# Patient Record
Sex: Female | Born: 1990 | Race: Black or African American | Hispanic: No | Marital: Single | Smoking: Never smoker
Health system: Southern US, Community
[De-identification: ages and names within clinical notes are randomized; demographics above are authoritative.]

## PROBLEM LIST (undated history)

## (undated) DIAGNOSIS — N83209 Unspecified ovarian cyst, unspecified side: Secondary | ICD-10-CM

## (undated) DIAGNOSIS — N390 Urinary tract infection, site not specified: Secondary | ICD-10-CM

---

## 2014-02-12 ENCOUNTER — Emergency Department (INDEPENDENT_AMBULATORY_CARE_PROVIDER_SITE_OTHER)
Admission: EM | Admit: 2014-02-12 | Discharge: 2014-02-12 | Disposition: A | Discharge status: Home / Self Care | Payer: Medicare Other | Source: Home / Self Care | Attending: Emergency Medicine | Admitting: Emergency Medicine

## 2014-02-12 ENCOUNTER — Encounter (HOSPITAL_COMMUNITY): Payer: Self-pay

## 2014-02-12 DIAGNOSIS — R350 Frequency of micturition: Secondary | ICD-10-CM

## 2014-02-12 LAB — POCT URINALYSIS DIP (DEVICE)
Bilirubin Urine: NEGATIVE
Glucose, UA: NEGATIVE mg/dL
Hgb urine dipstick: NEGATIVE
Ketones, ur: NEGATIVE mg/dL
LEUKOCYTES UA: NEGATIVE
Nitrite: NEGATIVE
PROTEIN: NEGATIVE mg/dL
Specific Gravity, Urine: >=1.03 (ref 1.005–1.030)
UROBILINOGEN UA: 0.2 mg/dL (ref 0.0–1.0)
pH: 5.5 (ref 5.0–8.0)

## 2014-02-12 LAB — POCT PREGNANCY, URINE: Preg Test, Ur: NEGATIVE

## 2014-02-12 LAB — GLUCOSE, CAPILLARY: Glucose-Capillary: 91 mg/dL (ref 70–99)

## 2014-02-12 MED ORDER — CEPHALEXIN 500 MG PO CAPS: 500.0000 mg | ORAL_CAPSULE | Freq: Three times a day (TID) | ORAL | Status: DC

## 2014-02-12 NOTE — Discharge Instructions (Signed)

## 2014-02-12 NOTE — ED Notes (Signed)
C/o 1 week duration of frequent urination. Denies pain, denies d/c, is not sexually active, and denies STD concerns today

## 2014-02-12 NOTE — ED Provider Notes (Signed)
Chief Complaint   Urinary Tract Infection   History of Present Illness   Mia Brown is a 24 year old female with schizophrenia who has had a one-week history of urinary frequency and dark colored urine. She denies any dysuria, painful urination, urgency, hematuria, cloudy urine, or malodorous urine. She's had no suprapubic or back pain and she denies nausea, vomiting, fever, or chills. She's had no GYN symptoms and is not sexually active. She has had urinary tract infections in the past, last was over a year ago.  Review of Systems   Other than as noted above, the patient denies any of the following symptoms: General:  No fevers or chills. GI:  No abdominal pain, back pain, nausea, or vomiting. GU:  No hematuria or incontinence. GYN:  No discharge, itching, vulvar pain or lesions, pelvic pain, or abnormal vaginal bleeding.  PMFSH   Past medical history, family history, social history, meds, and allergies were reviewed.  She takes Zyprexa.  Physical Examination     Vital signs:  BP 122/69 mmHg  Pulse 80  Temp(Src) 98.6 F (37 C) (Oral)  Resp 16  SpO2 97%  LMP  (LMP Unknown) Gen:  Alert, oriented, in no distress. Lungs:  Clear to auscultation, no wheezes, rales or rhonchi. Heart:  Regular rhythm, no gallop or murmer. Abdomen:  Flat and soft. There was slight suprapubic pain to palpation.  No guarding, or rebound.  No hepato-splenomegaly or mass.  Bowel sounds were normally active.  No hernia. Back:  No CVA tenderness.  Skin:  Clear, warm and dry.  Labs   Results for orders placed or performed during the hospital encounter of 02/12/14  Glucose, capillary  Result Value Ref Range   Glucose-Capillary 91 70 - 99 mg/dL  POCT urinalysis dip (device)  Result Value Ref Range   Glucose, UA NEGATIVE NEGATIVE mg/dL   Bilirubin Urine NEGATIVE NEGATIVE   Ketones, ur NEGATIVE NEGATIVE mg/dL   Specific Gravity, Urine >=1.030 1.005 - 1.030   Hgb urine dipstick NEGATIVE NEGATIVE     pH 5.5 5.0 - 8.0   Protein, ur NEGATIVE NEGATIVE mg/dL   Urobilinogen, UA 0.2 0.0 - 1.0 mg/dL   Nitrite NEGATIVE NEGATIVE   Leukocytes, UA NEGATIVE NEGATIVE  Pregnancy, urine POC  Result Value Ref Range   Preg Test, Ur NEGATIVE NEGATIVE     A urine culture was obtained.  Results are pending at this time and we will call about any positive results.  Assessment   The encounter diagnosis was Urinary frequency.   No evidence of pyelonephritis.    Plan   1.  Meds:  The following meds were prescribed:   Discharge Medication List as of 02/12/2014 11:13 AM    START taking these medications   Details  cephALEXin (KEFLEX) 500 MG capsule Take 1 capsule (500 mg total) by mouth 3 (three) times daily., Starting 02/12/2014, Until Discontinued, Normal        2.  Patient Education/Counseling:  The patient was given appropriate handouts, self care instructions, and instructed in symptomatic relief. The patient was told to avoid intercourse for 10 days, get extra fluids, and return for a follow up with her primary care doctor at the completion of treatment for a repeat UA and culture.    3.  Follow up:  The patient was told to follow up here if no better in 3 to 4 days, or sooner if becoming worse in any way, and given some red flag symptoms such as fever, persistent  vomiting, or severe flank or abdominal pain which would prompt immediate return.     Reuben Likes, MD 02/12/14 1140

## 2014-02-13 LAB — URINE CULTURE: Colony Count: 2000

## 2014-02-24 ENCOUNTER — Emergency Department (INDEPENDENT_AMBULATORY_CARE_PROVIDER_SITE_OTHER)
Admission: EM | Admit: 2014-02-24 | Discharge: 2014-02-24 | Disposition: A | Discharge status: Home / Self Care | Payer: Medicare Other | Source: Home / Self Care | Attending: Family Medicine | Admitting: Family Medicine

## 2014-02-24 ENCOUNTER — Encounter (HOSPITAL_COMMUNITY): Payer: Self-pay | Admitting: *Deleted

## 2014-02-24 DIAGNOSIS — N390 Urinary tract infection, site not specified: Secondary | ICD-10-CM | POA: Diagnosis not present

## 2014-02-24 LAB — POCT URINALYSIS DIP (DEVICE)
Bilirubin Urine: NEGATIVE
GLUCOSE, UA: NEGATIVE mg/dL
Ketones, ur: NEGATIVE mg/dL
NITRITE: NEGATIVE
PROTEIN: NEGATIVE mg/dL
SPECIFIC GRAVITY, URINE: 1.025 (ref 1.005–1.030)
UROBILINOGEN UA: 0.2 mg/dL (ref 0.0–1.0)
pH: 6.5 (ref 5.0–8.0)

## 2014-02-24 LAB — POCT PREGNANCY, URINE: Preg Test, Ur: NEGATIVE

## 2014-02-24 MED ORDER — CIPROFLOXACIN HCL 500 MG PO TABS: 500.0000 mg | ORAL_TABLET | Freq: Two times a day (BID) | ORAL | Status: DC

## 2014-02-24 NOTE — Discharge Instructions (Signed)
Take all of medicine as directed, drink lots of fluids, see your doctor if further problems. °

## 2014-02-24 NOTE — ED Notes (Signed)
PT  REPORTS   SYMPTOMS  OF  FREQUENT  URINATION  WITH  DISCOMFORT      AND  DARK  APPEARANCE  -   PT  WAS   SEEM 12  DAYS  AGO  AT THE  [facility]  AND  WAS  GIVEN  RX       BUT  STILL  HAS  SYMPTOMS

## 2014-02-24 NOTE — ED Provider Notes (Signed)
CSN: [id number]     Arrival date & time 02/24/14  1414 History   First MD Initiated Contact with Patient 02/24/14 1549     Chief Complaint  Patient presents with  . Urinary Tract Infection   (Consider location/radiation/quality/duration/timing/severity/associated sxs/prior Treatment) Patient is a 24 y.o. female presenting with urinary tract infection. The history is provided by the patient.  Urinary Tract Infection This is a recurrent problem. The current episode started more than 1 week ago (seen 1/13 for uti and given keflex, ucult was neg, sx of freq continue, no vag sx or fever.). The problem has not changed since onset.Pertinent negatives include no chest pain and no abdominal pain.    History reviewed. No pertinent past medical history. History reviewed. No pertinent past surgical history. History reviewed. No pertinent family history. History  Substance Use Topics  . Smoking status: Never Smoker   . Smokeless tobacco: Not on file  . Alcohol Use: No   OB History    No data available     Review of Systems  Constitutional: Negative.   Cardiovascular: Negative for chest pain.  Gastrointestinal: Negative.  Negative for abdominal pain.  Genitourinary: Positive for frequency. Negative for dysuria, urgency, vaginal bleeding and vaginal discharge.    Allergies  Review of patient's allergies indicates no known allergies.  Home Medications   Prior to Admission medications   Medication Sig Start Date End Date Taking? Authorizing Provider  cephALEXin (KEFLEX) 500 MG capsule Take 1 capsule (500 mg total) by mouth 3 (three) times daily. 02/12/14   Reuben Likesavid C Keller, MD  ciprofloxacin (CIPRO) 500 MG tablet Take 1 tablet (500 mg total) by mouth 2 (two) times daily. 02/24/14   Linna HoffJames D Kindl, MD   BP 139/78 mmHg  Pulse 74  Temp(Src) 98 F (36.7 C) (Oral)  Resp 16  SpO2 100%  LMP 02/24/2014 Physical Exam  Constitutional: She is oriented to person, place, and time. She appears  well-developed and well-nourished.  Abdominal: Soft. Bowel sounds are normal. She exhibits no distension and no mass. There is no tenderness. There is no rebound and no guarding.  Neurological: She is alert and oriented to person, place, and time.  Skin: Skin is warm and dry.  Nursing note and vitals reviewed.   ED Course  Procedures (including critical care time) Labs Review Labs Reviewed  POCT URINALYSIS DIP (DEVICE) - Abnormal; Notable for the following:    Hgb urine dipstick LARGE (*)    Leukocytes, UA SMALL (*)    All other components within normal limits  POCT PREGNANCY, URINE    Imaging Review No results found.   MDM   1. UTI (lower urinary tract infection)        Linna HoffJames D Kindl, MD 02/24/14 [phone]

## 2014-12-13 ENCOUNTER — Encounter (HOSPITAL_COMMUNITY): Payer: Self-pay

## 2014-12-13 DIAGNOSIS — R5381 Other malaise: Secondary | ICD-10-CM | POA: Insufficient documentation

## 2014-12-13 DIAGNOSIS — Z3202 Encounter for pregnancy test, result negative: Secondary | ICD-10-CM | POA: Diagnosis not present

## 2014-12-13 DIAGNOSIS — Z87440 Personal history of urinary (tract) infections: Secondary | ICD-10-CM | POA: Diagnosis not present

## 2014-12-13 DIAGNOSIS — Z792 Long term (current) use of antibiotics: Secondary | ICD-10-CM | POA: Diagnosis not present

## 2014-12-13 DIAGNOSIS — R6883 Chills (without fever): Secondary | ICD-10-CM | POA: Diagnosis not present

## 2014-12-13 DIAGNOSIS — R61 Generalized hyperhidrosis: Secondary | ICD-10-CM | POA: Diagnosis not present

## 2014-12-13 DIAGNOSIS — R51 Headache: Secondary | ICD-10-CM | POA: Insufficient documentation

## 2014-12-13 DIAGNOSIS — R531 Weakness: Secondary | ICD-10-CM | POA: Diagnosis present

## 2014-12-13 NOTE — ED Notes (Signed)
Pt reports lack of energy all day today. She states she started to have chills and her palms started to sweat. Denies N/V/D/abd pain. Pt states, "I just feel weird."

## 2014-12-14 ENCOUNTER — Emergency Department (HOSPITAL_COMMUNITY)
Admission: EM | Admit: 2014-12-14 | Discharge: 2014-12-14 | Disposition: A | Discharge status: Home / Self Care | Payer: Medicare Other | Attending: Emergency Medicine | Admitting: Emergency Medicine

## 2014-12-14 DIAGNOSIS — R5381 Other malaise: Secondary | ICD-10-CM

## 2014-12-14 LAB — COMPREHENSIVE METABOLIC PANEL
ALT: 22 U/L (ref 14–54)
AST: 30 U/L (ref 15–41)
Albumin: 4.7 g/dL (ref 3.5–5.0)
Alkaline Phosphatase: 42 U/L (ref 38–126)
Anion gap: 8 (ref 5–15)
BILIRUBIN TOTAL: 0.5 mg/dL (ref 0.3–1.2)
BUN: 8 mg/dL (ref 6–20)
CHLORIDE: 103 mmol/L (ref 101–111)
CO2: 26 mmol/L (ref 22–32)
Calcium: 10.1 mg/dL (ref 8.9–10.3)
Creatinine, Ser: 1.01 mg/dL — ABNORMAL HIGH (ref 0.44–1.00)
Glucose, Bld: 88 mg/dL (ref 65–99)
POTASSIUM: 3.7 mmol/L (ref 3.5–5.1)
Sodium: 137 mmol/L (ref 135–145)
TOTAL PROTEIN: 8.4 g/dL — AB (ref 6.5–8.1)

## 2014-12-14 LAB — CBC WITH DIFFERENTIAL/PLATELET
BASOS ABS: 0 10*3/uL (ref 0.0–0.1)
Basophils Relative: 0 %
EOS PCT: 1 %
Eosinophils Absolute: 0.1 10*3/uL (ref 0.0–0.7)
HCT: 41.9 % (ref 36.0–46.0)
Hemoglobin: 14.3 g/dL (ref 12.0–15.0)
LYMPHS ABS: 2.6 10*3/uL (ref 0.7–4.0)
LYMPHS PCT: 54 %
MCH: 28.3 pg (ref 26.0–34.0)
MCHC: 34.1 g/dL (ref 30.0–36.0)
MCV: 82.8 fL (ref 78.0–100.0)
Monocytes Absolute: 0.3 10*3/uL (ref 0.1–1.0)
Monocytes Relative: 5 %
NEUTROS PCT: 40 %
Neutro Abs: 2 10*3/uL (ref 1.7–7.7)
PLATELETS: 266 10*3/uL (ref 150–400)
RBC: 5.06 MIL/uL (ref 3.87–5.11)
RDW: 13.9 % (ref 11.5–15.5)
WBC: 4.9 10*3/uL (ref 4.0–10.5)

## 2014-12-14 LAB — URINALYSIS, ROUTINE W REFLEX MICROSCOPIC
Bilirubin Urine: NEGATIVE
GLUCOSE, UA: NEGATIVE mg/dL
KETONES UR: 15 mg/dL — AB
Nitrite: NEGATIVE
Protein, ur: NEGATIVE mg/dL
Specific Gravity, Urine: 1.018 (ref 1.005–1.030)
Urobilinogen, UA: 0.2 mg/dL (ref 0.0–1.0)
pH: 5 (ref 5.0–8.0)

## 2014-12-14 LAB — URINE MICROSCOPIC-ADD ON

## 2014-12-14 LAB — POC URINE PREG, ED: PREG TEST UR: NEGATIVE

## 2014-12-14 MED ORDER — SODIUM CHLORIDE 0.9 % IV SOLN
1000.0000 mL | Freq: Once | INTRAVENOUS | Status: AC
  Administered 2014-12-14: 1000 mL via INTRAVENOUS

## 2014-12-14 MED ORDER — SODIUM CHLORIDE 0.9 % IV SOLN
1000.0000 mL | INTRAVENOUS | Status: DC
  Administered 2014-12-14: 1000 mL via INTRAVENOUS

## 2014-12-14 NOTE — ED Provider Notes (Signed)
CSN: [id number]     Arrival date & time 12/13/14  2333 History  By signing my name below, I, [facility], attest that this documentation has been prepared under the direction and in the presence of Dione Boozeavid Ahley Bulls, MD. Electronically Signed: Randell PatientMarrissa Brown, ED Scribe. 12/14/2014. 1:05 AM.    Chief Complaint  Patient presents with  . Weakness   HPI HPI Comments: Mia Brown is a 24 y.o. female who presents to the Emergency Department complaining of constant, unchanging generalized fatigue that began earlier this morning. Patient reports associated headache, diaphoresis, and chills. Patient has not tried any treatments. She has eaten normal amounts of food twice today. Patient denies nausea, vomiting, dysuria, urine frequency, abdominal pain, or appetite changes. Patient currently does not take any drugs or drink EtOH.  PCP: Dr. Fleeta EmmerAshley Marlow   History reviewed. No pertinent past medical history. History reviewed. No pertinent past surgical history. No family history on file. Social History  Substance Use Topics  . Smoking status: Never Smoker   . Smokeless tobacco: None  . Alcohol Use: No   OB History    No data available     Review of Systems  Constitutional: Positive for chills, diaphoresis and fatigue. Negative for fever and appetite change.  Gastrointestinal: Negative for nausea, vomiting, abdominal pain and diarrhea.  Genitourinary: Negative for dysuria and frequency.  Neurological: Positive for headaches.      Allergies  Review of patient's allergies indicates no known allergies.  Home Medications   Prior to Admission medications   Medication Sig Start Date End Date Taking? Authorizing Provider  cephALEXin (KEFLEX) 500 MG capsule Take 1 capsule (500 mg total) by mouth 3 (three) times daily. 02/12/14   Reuben Likesavid C Keller, MD  ciprofloxacin (CIPRO) 500 MG tablet Take 1 tablet (500 mg total) by mouth 2 (two) times daily. 02/24/14   Linna HoffJames D Kindl, MD   BP 141/99  mmHg  Pulse 96  Temp(Src) 98.6 F (37 C) (Oral)  Resp 20  Ht 5\' 8"  (1.727 m)  Wt 166 lb (75.297 kg)  BMI 25.25 kg/m2  SpO2 99%  LMP 09/12/2014 Physical Exam  Constitutional: She is oriented to person, place, and time. She appears well-developed and well-nourished. No distress.  HENT:  Head: Normocephalic and atraumatic.  Eyes: Conjunctivae and EOM are normal.  Neck: Neck supple. No tracheal deviation present.  Cardiovascular: Normal rate and regular rhythm.   Pulmonary/Chest: Effort normal. No respiratory distress.  Abdominal: Soft. Bowel sounds are normal.  Musculoskeletal: Normal range of motion.  Neurological: She is alert and oriented to person, place, and time.  Skin: Skin is warm and dry.  Psychiatric: She has a normal mood and affect. Her behavior is normal.  Nursing note and vitals reviewed.   ED Course  Procedures  DIAGNOSTIC STUDIES: Oxygen Saturation is 99% on RA, normal by my interpretation.    COORDINATION OF CARE: 12:57 AM Will administer IV fluids. Ordered urinalysis and CBC. Discussed treatment plan with pt at bedside and pt agreed to plan.  1:54 AM Upon re-check patient has improved. Discussed lab results with patient. Advised patient to increase fluid intake.   Labs Review Results for orders placed or performed during the hospital encounter of 12/14/14  Comprehensive metabolic panel  Result Value Ref Range   Sodium 137 135 - 145 mmol/L   Potassium 3.7 3.5 - 5.1 mmol/L   Chloride 103 101 - 111 mmol/L   CO2 26 22 - 32 mmol/L   Glucose, Bld 88 65 -  99 mg/dL   BUN 8 6 - 20 mg/dL   Creatinine, Ser 1.61 (H) 0.44 - 1.00 mg/dL   Calcium 09.6 8.9 - 04.5 mg/dL   Total Protein 8.4 (H) 6.5 - 8.1 g/dL   Albumin 4.7 3.5 - 5.0 g/dL   AST 30 15 - 41 U/L   ALT 22 14 - 54 U/L   Alkaline Phosphatase 42 38 - 126 U/L   Total Bilirubin 0.5 0.3 - 1.2 mg/dL   GFR calc non Af Amer >60 >60 mL/min   GFR calc Af Amer >60 >60 mL/min   Anion gap 8 5 - 15  CBC with  Differential  Result Value Ref Range   WBC 4.9 4.0 - 10.5 K/uL   RBC 5.06 3.87 - 5.11 MIL/uL   Hemoglobin 14.3 12.0 - 15.0 g/dL   HCT 40.9 81.1 - 91.4 %   MCV 82.8 78.0 - 100.0 fL   MCH 28.3 26.0 - 34.0 pg   MCHC 34.1 30.0 - 36.0 g/dL   RDW 78.2 95.6 - 21.3 %   Platelets 266 150 - 400 K/uL   Neutrophils Relative % 40 %   Neutro Abs 2.0 1.7 - 7.7 K/uL   Lymphocytes Relative 54 %   Lymphs Abs 2.6 0.7 - 4.0 K/uL   Monocytes Relative 5 %   Monocytes Absolute 0.3 0.1 - 1.0 K/uL   Eosinophils Relative 1 %   Eosinophils Absolute 0.1 0.0 - 0.7 K/uL   Basophils Relative 0 %   Basophils Absolute 0.0 0.0 - 0.1 K/uL  Urinalysis, Routine w reflex microscopic  Result Value Ref Range   Color, Urine YELLOW YELLOW   APPearance CLOUDY (A) CLEAR   Specific Gravity, Urine 1.018 1.005 - 1.030   pH 5.0 5.0 - 8.0   Glucose, UA NEGATIVE NEGATIVE mg/dL   Hgb urine dipstick LARGE (A) NEGATIVE   Bilirubin Urine NEGATIVE NEGATIVE   Ketones, ur 15 (A) NEGATIVE mg/dL   Protein, ur NEGATIVE NEGATIVE mg/dL   Urobilinogen, UA 0.2 0.0 - 1.0 mg/dL   Nitrite NEGATIVE NEGATIVE   Leukocytes, UA SMALL (A) NEGATIVE  Urine microscopic-add on  Result Value Ref Range   Squamous Epithelial / LPF RARE RARE   WBC, UA 3-6 <3 WBC/hpf   RBC / HPF 7-10 <3 RBC/hpf   Bacteria, UA FEW (A) RARE  POC Urine Pregnancy, ED (do NOT order at [facility])  Result Value Ref Range   Preg Test, Ur NEGATIVE NEGATIVE   I have personally reviewed and evaluated these lab results as part of my medical decision-making.  MDM   Final diagnoses:  Malaise    Malaise and weakness of uncertain cause. Suspect viral illness. Old records are reviewed and she has prior ED visit for UTI. Urinalysis will be checked as well as screening labs and she'll be given IV hydration.  She feels much better after IV hydration. Laboratory workup is significant for right shift on CBC suggesting viral illness. She is reassured of about benign workup and is  discharged with instructions to encourage oral fluids.  I personally performed the services described in this documentation, which was scribed in my presence. The recorded information has been reviewed and is accurate.      Dione Booze, MD 12/14/14 [phone]

## 2014-12-14 NOTE — ED Notes (Signed)
Dr Glick in room. 

## 2014-12-14 NOTE — Discharge Instructions (Signed)
Drink plenty of fluids.  Return if symptoms are getting worse. 

## 2015-02-28 ENCOUNTER — Emergency Department (HOSPITAL_COMMUNITY)
Admission: EM | Admit: 2015-02-28 | Discharge: 2015-02-28 | Disposition: A | Discharge status: Home / Self Care | Payer: Medicare Other | Attending: Emergency Medicine | Admitting: Emergency Medicine

## 2015-02-28 ENCOUNTER — Encounter (HOSPITAL_COMMUNITY): Payer: Self-pay | Admitting: Emergency Medicine

## 2015-02-28 ENCOUNTER — Emergency Department (HOSPITAL_COMMUNITY): Payer: Medicare Other

## 2015-02-28 DIAGNOSIS — N898 Other specified noninflammatory disorders of vagina: Secondary | ICD-10-CM | POA: Diagnosis not present

## 2015-02-28 DIAGNOSIS — R1031 Right lower quadrant pain: Secondary | ICD-10-CM | POA: Insufficient documentation

## 2015-02-28 DIAGNOSIS — Z79899 Other long term (current) drug therapy: Secondary | ICD-10-CM | POA: Insufficient documentation

## 2015-02-28 DIAGNOSIS — R350 Frequency of micturition: Secondary | ICD-10-CM | POA: Insufficient documentation

## 2015-02-28 DIAGNOSIS — Z8742 Personal history of other diseases of the female genital tract: Secondary | ICD-10-CM | POA: Insufficient documentation

## 2015-02-28 DIAGNOSIS — R1030 Lower abdominal pain, unspecified: Secondary | ICD-10-CM | POA: Insufficient documentation

## 2015-02-28 DIAGNOSIS — Z87440 Personal history of urinary (tract) infections: Secondary | ICD-10-CM | POA: Diagnosis not present

## 2015-02-28 DIAGNOSIS — Z3202 Encounter for pregnancy test, result negative: Secondary | ICD-10-CM | POA: Insufficient documentation

## 2015-02-28 DIAGNOSIS — R102 Pelvic and perineal pain: Secondary | ICD-10-CM | POA: Diagnosis not present

## 2015-02-28 DIAGNOSIS — R3915 Urgency of urination: Secondary | ICD-10-CM | POA: Insufficient documentation

## 2015-02-28 HISTORY — DX: Urinary tract infection, site not specified: N39.0

## 2015-02-28 HISTORY — DX: Unspecified ovarian cyst, unspecified side: N83.209

## 2015-02-28 LAB — URINALYSIS, ROUTINE W REFLEX MICROSCOPIC
Bilirubin Urine: NEGATIVE
GLUCOSE, UA: NEGATIVE mg/dL
Hgb urine dipstick: NEGATIVE
Ketones, ur: NEGATIVE mg/dL
LEUKOCYTES UA: NEGATIVE
Nitrite: NEGATIVE
Protein, ur: NEGATIVE mg/dL
Specific Gravity, Urine: 1.023 (ref 1.005–1.030)
pH: 5 (ref 5.0–8.0)

## 2015-02-28 LAB — POC URINE PREG, ED: PREG TEST UR: NEGATIVE

## 2015-02-28 LAB — WET PREP, GENITAL
CLUE CELLS WET PREP: NONE SEEN
Sperm: NONE SEEN
TRICH WET PREP: NONE SEEN
Yeast Wet Prep HPF POC: NONE SEEN

## 2015-02-28 MED ORDER — PHENAZOPYRIDINE HCL 200 MG PO TABS: 200.0000 mg | ORAL_TABLET | Freq: Three times a day (TID) | ORAL | Status: DC

## 2015-02-28 NOTE — ED Provider Notes (Signed)
CSN: [id number]     Arrival date & time 02/28/15  0757 History   First MD Initiated Contact with Patient 02/28/15 0800     Chief Complaint  Patient presents with  . Urinary Tract Infection     (Consider location/radiation/quality/duration/timing/severity/associated sxs/prior Treatment) HPI Comments: Patient presents with complaint of persistent urinary tract infection. Patient states that she started having symptoms approximately 10 days ago. She describes suprapubic and right lower quadrant abdominal pain, increased frequency and urgency with urination. She denies any fevers, vomiting. No vaginal bleeding or discharge. Patient states that she is not sexually active at the current time. Patient was seen at outside urgent care where she was prescribed Macrobid 100 mg twice a day. She took this course of antibiotics without improvement. She was seen back again and started on Cipro 500 mg twice a day. She has taken 2 days worth of this. She states that she is not really feeling much better. Her abdominal pain is persistent but slightly improved this morning. No history of abdominal surgeries. No other treatments prior to arrival. Onset of symptoms acute. Course is constant. Nothing makes symptoms better or worse.  Patient is a 25 y.o. female presenting with urinary tract infection. The history is provided by the patient.  Urinary Tract Infection Associated symptoms: abdominal pain   Associated symptoms: no fever, no flank pain, no nausea, no vaginal discharge and no vomiting     Past Medical History  Diagnosis Date  . UTI (lower urinary tract infection)   . Ovarian cyst    History reviewed. No pertinent past surgical history. History reviewed. No pertinent family history. Social History  Substance Use Topics  . Smoking status: Never Smoker   . Smokeless tobacco: Never Used  . Alcohol Use: No   OB History    No data available     Review of Systems  Constitutional: Negative for fever.   HENT: Negative for rhinorrhea and sore throat.   Eyes: Negative for redness.  Respiratory: Negative for cough.   Cardiovascular: Negative for chest pain.  Gastrointestinal: Positive for abdominal pain. Negative for nausea, vomiting and diarrhea.  Genitourinary: Positive for urgency and frequency. Negative for dysuria, hematuria, flank pain, vaginal bleeding, vaginal discharge and pelvic pain.  Musculoskeletal: Negative for myalgias.  Skin: Negative for rash.  Neurological: Negative for headaches.      Allergies  Review of patient's allergies indicates no known allergies.  Home Medications   Prior to Admission medications   Medication Sig Start Date End Date Taking? Authorizing Provider  OLANZapine (ZYPREXA) 10 MG tablet Take 10 mg by mouth at bedtime.    Historical Provider, MD   BP 130/88 mmHg  Pulse 62  Temp(Src) 97.8 F (36.6 C) (Oral)  Resp 16  SpO2 99%  LMP 01/22/2015   Physical Exam  Constitutional: She appears well-developed and well-nourished.  HENT:  Head: Normocephalic and atraumatic.  Eyes: Conjunctivae are normal. Right eye exhibits no discharge. Left eye exhibits no discharge.  Neck: Normal range of motion. Neck supple.  Cardiovascular: Normal rate, regular rhythm and normal heart sounds.   Pulmonary/Chest: Effort normal and breath sounds normal.  Abdominal: Soft. She exhibits no distension. There is tenderness (minimal right lower quadrant tenderness and suprapubic tenderness without rebound or guarding.). There is no rebound and no guarding.  Genitourinary: Uterus normal. There is no rash, tenderness or lesion on the right labia. There is no rash, tenderness or lesion on the left labia. Uterus is not enlarged and not tender. Cervix  exhibits discharge (mucous). Cervix exhibits no motion tenderness. Right adnexum displays tenderness. Right adnexum displays no mass and no fullness. Left adnexum displays no mass, no tenderness and no fullness. No signs of injury  around the vagina. Vaginal discharge (mucous) found.  Neurological: She is alert.  Skin: Skin is warm and dry.  Psychiatric: She has a normal mood and affect.  Nursing note and vitals reviewed.   ED Course  Procedures (including critical care time) Labs Review Labs Reviewed  WET PREP, GENITAL - Abnormal; Notable for the following:    WBC, Wet Prep HPF POC MANY (*)    All other components within normal limits  URINALYSIS, ROUTINE W REFLEX MICROSCOPIC (NOT AT [facility]) - Abnormal; Notable for the following:    APPearance CLOUDY (*)    All other components within normal limits  URINE CULTURE  POC URINE PREG, ED  GC/CHLAMYDIA PROBE AMP (Green) NOT AT [facility]    Imaging Review US Transvaginal Non-ob  02/28/2015  CLINICAL DATA:  Two week history of right lower quadrant pain. EXAM: TRANSABDOMINAL AND TRANSVAGINAL ULTRASOUND OF PELVIS TECHNIQUE: Both transabdominal and transvaginal ultrasound examinations of the pelvis were performed. Transabdominal technique was performed for global imaging of the pelvis including uterus, ovaries, adnexal regions, and pelvic cul-de-sac. It was necessary to proceed with endovaginal exam following the transabdominal exam to visualize the ovaries. COMPARISON:  None FINDINGS: Uterus Measurements: 8.2 x 3.7 x 4.6 cm. No fibroids or other mass visualized. Endometrium Thickness: 9 mm.  No focal abnormality visualized. Right ovary Measurements: 3.5 x 2.1 x 3.0 cm. Normal appearance/no adnexal mass. Left ovary Measurements: 4.5 x 1.6 x 3.0 cm. Normal appearance/no adnexal mass. Other findings No abnormal free fluid. IMPRESSION: Normal pelvic ultrasound. Electronically Signed   By: Kennith Center M.D.   On: 02/28/2015 11:43   US Pelvis Complete  02/28/2015  CLINICAL DATA:  Two week history of right lower quadrant pain. EXAM: TRANSABDOMINAL AND TRANSVAGINAL ULTRASOUND OF PELVIS TECHNIQUE: Both transabdominal and transvaginal ultrasound examinations of the pelvis were  performed. Transabdominal technique was performed for global imaging of the pelvis including uterus, ovaries, adnexal regions, and pelvic cul-de-sac. It was necessary to proceed with endovaginal exam following the transabdominal exam to visualize the ovaries. COMPARISON:  None FINDINGS: Uterus Measurements: 8.2 x 3.7 x 4.6 cm. No fibroids or other mass visualized. Endometrium Thickness: 9 mm.  No focal abnormality visualized. Right ovary Measurements: 3.5 x 2.1 x 3.0 cm. Normal appearance/no adnexal mass. Left ovary Measurements: 4.5 x 1.6 x 3.0 cm. Normal appearance/no adnexal mass. Other findings No abnormal free fluid. IMPRESSION: Normal pelvic ultrasound. Electronically Signed   By: Kennith Center M.D.   On: 02/28/2015 11:43   I have personally reviewed and evaluated these images and lab results as part of my medical decision-making.   EKG Interpretation None       8:39 AM Patient seen and examined. Work-up initiated. Medications ordered.   Vital signs reviewed and are as follows: BP 130/88 mmHg  Pulse 62  Temp(Src) 97.8 F (36.6 C) (Oral)  Resp 16  SpO2 99%  LMP 01/22/2015  9:17 AM UA without signs of UTI. Will proceed with pelvic exam.   9:35 AM Pelvic exam performed with NT chaperone. Given R adnexal pain will obtain US. No abdominal pain.   12:03 PM ultrasound is normal. Wet prep is unrevealing.  Will treat with Pyridium. Patient encouraged to follow-up with her PCP for further evaluation. She will complete course of antibiotics as previously prescribed.  The patient was urged to return to the Emergency Department immediately with worsening of current symptoms, worsening abdominal pain, persistent vomiting, blood noted in stools, fever, or any other concerns. The patient verbalized understanding.    MDM   Final diagnoses:  Suprapubic pain, unspecified laterality  Increased urinary frequency   Patient presenting with lower abdominal pain, urinary irritative symptoms  including frequency and urgency. Patient was recently treated for urinary tract infection. UA is clear today. Patient had some pelvic tenderness on pelvic exam without any other infectious findings. Ultrasound was ordered which was negative. Patient does not have any true abdominal tenderness on exam and pain seems to be more in the pelvis. There is no free fluid to suggest significant intra-abdominal infection. I do not feel that lab workup is indicated at this point for further evaluation of abdominal pain. Patient appears well, nontoxic. Will treat symptomatically and have her follow-up.    Renne Crigler, PA-C 02/28/15 1205  Arby Barrette, MD 03/08/15 [phone]

## 2015-02-28 NOTE — ED Notes (Signed)
Pt from home with continuing UTI symptoms.  Pt took a Macrobid 100 mg BID x 5 days with no relief.  Was started on Cipro  500 mg BID x 7 days 2 days ago.  Pt reports lower abdominal pain and RLQ pain.  NAD, A&O.

## 2015-02-28 NOTE — Discharge Instructions (Signed)
Please read and follow all provided instructions.  Your diagnoses today include:  1. Suprapubic pain, unspecified laterality   2. Pelvic pain in female   3. Increased urinary frequency     Tests performed today include:  Urine test to look for infection and pregnancy (in women) - no sign of infection  Ultrasound - no cysts  Vital signs. See below for your results today.   Medications prescribed:   Pyridium - medication for urinary tract infection symptoms.   This medication will turn your urine orange. This is normal.   Take any prescribed medications only as directed.  Home care instructions:   Follow any educational materials contained in this packet.  Follow-up instructions: Please follow-up with your primary care provider in the next 3 days for further evaluation of your symptoms.    Return instructions:  SEEK IMMEDIATE MEDICAL ATTENTION IF:  The pain does not go away or becomes severe   A temperature above 101F develops   Repeated vomiting occurs (multiple episodes)   The pain becomes localized to portions of the abdomen. The right side could possibly be appendicitis. In an adult, the left lower portion of the abdomen could be colitis or diverticulitis.   Blood is being passed in stools or vomit (bright red or black tarry stools)   You develop chest pain, difficulty breathing, dizziness or fainting, or become confused, poorly responsive, or inconsolable (young children)  If you have any other emergent concerns regarding your health  Additional Information: Abdominal (belly) pain can be caused by many things. Your caregiver performed an examination and possibly ordered blood/urine tests and imaging (CT scan, x-rays, ultrasound). Many cases can be observed and treated at home after initial evaluation in the emergency department. Even though you are being discharged home, abdominal pain can be unpredictable. Therefore, you need a repeated exam if your pain does not  resolve, returns, or worsens. Most patients with abdominal pain don't have to be admitted to the hospital or have surgery, but serious problems like appendicitis and gallbladder attacks can start out as nonspecific pain. Many abdominal conditions cannot be diagnosed in one visit, so follow-up evaluations are very important.  Your vital signs today were: BP 118/80 mmHg   Pulse 71   Temp(Src) 97.8 F (36.6 C) (Oral)   Resp 16   SpO2 100%   LMP 01/22/2015 If your blood pressure (bp) was elevated above 135/85 this visit, please have this repeated by your doctor within one month. --------------

## 2015-02-28 NOTE — ED Notes (Signed)
Pt collecting urine sample and changing clothes.

## 2015-03-01 LAB — URINE CULTURE: Culture: NO GROWTH

## 2015-03-02 LAB — GC/CHLAMYDIA PROBE AMP (~~LOC~~) NOT AT ARMC
Chlamydia: NEGATIVE
NEISSERIA GONORRHEA: NEGATIVE

## 2015-03-16 ENCOUNTER — Encounter (HOSPITAL_COMMUNITY): Payer: Self-pay | Admitting: Emergency Medicine

## 2015-03-16 ENCOUNTER — Emergency Department (INDEPENDENT_AMBULATORY_CARE_PROVIDER_SITE_OTHER)
Admission: EM | Admit: 2015-03-16 | Discharge: 2015-03-16 | Disposition: A | Discharge status: Home / Self Care | Payer: Medicare Other | Source: Home / Self Care | Attending: Emergency Medicine | Admitting: Emergency Medicine

## 2015-03-16 DIAGNOSIS — R300 Dysuria: Secondary | ICD-10-CM | POA: Diagnosis not present

## 2015-03-16 DIAGNOSIS — N390 Urinary tract infection, site not specified: Secondary | ICD-10-CM | POA: Diagnosis not present

## 2015-03-16 LAB — POCT URINALYSIS DIP (DEVICE)
BILIRUBIN URINE: NEGATIVE
GLUCOSE, UA: NEGATIVE mg/dL
HGB URINE DIPSTICK: NEGATIVE
Ketones, ur: NEGATIVE mg/dL
NITRITE: NEGATIVE
Protein, ur: NEGATIVE mg/dL
Specific Gravity, Urine: 1.02 (ref 1.005–1.030)
Urobilinogen, UA: 0.2 mg/dL (ref 0.0–1.0)
pH: 5.5 (ref 5.0–8.0)

## 2015-03-16 MED ORDER — PHENAZOPYRIDINE HCL 200 MG PO TABS: 200.0000 mg | ORAL_TABLET | Freq: Three times a day (TID) | ORAL | Status: DC | PRN

## 2015-03-16 MED ORDER — CEPHALEXIN 250 MG PO CAPS: 250.0000 mg | ORAL_CAPSULE | Freq: Four times a day (QID) | ORAL | Status: DC

## 2015-03-16 NOTE — ED Provider Notes (Signed)
CSN: [id number]     Arrival date & time 03/16/15  1531 History   None    Chief Complaint  Patient presents with  . Dysuria  . Vaginal Discharge   (Consider location/radiation/quality/duration/timing/severity/associated sxs/prior Treatment) HPI History obtained from patient:   LOCATION:vaginal SEVERITY:no pain DURATION:1 week CONTEXT: recent UTI treated through ED QUALITY:similar to previous infections MODIFYING FACTORS:none ASSOCIATED SYMPTOMS:irritation, itching TIMING:constant OCCUPATION:  Past Medical History  Diagnosis Date  . UTI (lower urinary tract infection)   . Ovarian cyst    History reviewed. No pertinent past surgical history. History reviewed. No pertinent family history. Social History  Substance Use Topics  . Smoking status: Never Smoker   . Smokeless tobacco: Never Used  . Alcohol Use: No   OB History    No data available     Review of Systems ROS +'ve Dysuria  Denies: HEADACHE, NAUSEA, ABDOMINAL PAIN, CHEST PAIN, CONGESTION,  SHORTNESS OF BREATH  Allergies  Review of patient's allergies indicates no known allergies.  Home Medications   Prior to Admission medications   Medication Sig Start Date End Date Taking? Authorizing Provider  OLANZapine (ZYPREXA) 10 MG tablet Take 10 mg by mouth at bedtime.   Yes Historical Provider, MD  ciprofloxacin (CIPRO) 500 MG tablet Take 500 mg by mouth 2 (two) times daily. 02/25/15   Historical Provider, MD  phenazopyridine (PYRIDIUM) 200 MG tablet Take 1 tablet (200 mg total) by mouth 3 (three) times daily. 02/28/15   Renne Crigler, PA-C   Meds Ordered and Administered this Visit  Medications - No data to display  BP 129/81 mmHg  Pulse 68  Temp(Src) 99.4 F (37.4 C) (Oral)  Resp 18  SpO2 100%  LMP 01/22/2015 No data found.   Physical Exam  Constitutional: She is oriented to person, place, and time. She appears well-developed and well-nourished. No distress.  HENT:  Head: Normocephalic and atraumatic.   Pulmonary/Chest: Effort normal.  Musculoskeletal: Normal range of motion.  Neurological: She is alert and oriented to person, place, and time.  Skin: Skin is warm and dry.  Psychiatric: She has a normal mood and affect. Her behavior is normal.    ED Course  Procedures (including critical care time)  Labs Review Labs Reviewed  POCT URINALYSIS DIP (DEVICE) - Abnormal; Notable for the following:    Leukocytes, UA TRACE (*)    All other components within normal limits    Imaging Review No results found.   Visual Acuity Review  Right Eye Distance:   Left Eye Distance:   Bilateral Distance:    Right Eye Near:   Left Eye Near:    Bilateral Near:         MDM   1. UTI (lower urinary tract infection)   2. Dysuria    Patient is advised to continue home symptomatic treatment. Prescription for keflex/pyridium sent pharmacy patient has indicated. Patient is advised that if there are new or worsening symptoms or attend the emergency department, or contact primary care provider. Instructions of care provided discharged home in stable condition. Return to work/school note provided.  THIS NOTE WAS GENERATED USING A VOICE RECOGNITION SOFTWARE PROGRAM. ALL REASONABLE EFFORTS  WERE MADE TO PROOFREAD THIS DOCUMENT FOR ACCURACY.     Tharon Aquas, PA 03/17/15 [phone]

## 2015-03-16 NOTE — ED Notes (Signed)
The patient presented to the [facility] with a complaint of vaginal itching, discharge and dysuria x 10 days.

## 2015-03-16 NOTE — Discharge Instructions (Signed)

## 2015-03-27 ENCOUNTER — Other Ambulatory Visit (HOSPITAL_COMMUNITY)
Admission: RE | Admit: 2015-03-27 | Discharge: 2015-03-27 | Disposition: A | Discharge status: Home / Self Care | Payer: Medicare Other | Source: Ambulatory Visit | Attending: Emergency Medicine | Admitting: Emergency Medicine

## 2015-03-27 ENCOUNTER — Emergency Department (INDEPENDENT_AMBULATORY_CARE_PROVIDER_SITE_OTHER)
Admission: EM | Admit: 2015-03-27 | Discharge: 2015-03-27 | Disposition: A | Discharge status: Home / Self Care | Payer: Medicare Other | Source: Home / Self Care | Attending: Emergency Medicine | Admitting: Emergency Medicine

## 2015-03-27 ENCOUNTER — Encounter (HOSPITAL_COMMUNITY): Payer: Self-pay

## 2015-03-27 DIAGNOSIS — N760 Acute vaginitis: Secondary | ICD-10-CM

## 2015-03-27 LAB — POCT URINALYSIS DIP (DEVICE)
BILIRUBIN URINE: NEGATIVE
GLUCOSE, UA: NEGATIVE mg/dL
Hgb urine dipstick: NEGATIVE
KETONES UR: NEGATIVE mg/dL
NITRITE: NEGATIVE
PH: 6 (ref 5.0–8.0)
PROTEIN: NEGATIVE mg/dL
SPECIFIC GRAVITY, URINE: 1.02 (ref 1.005–1.030)
Urobilinogen, UA: 0.2 mg/dL (ref 0.0–1.0)

## 2015-03-27 LAB — POCT PREGNANCY, URINE: PREG TEST UR: NEGATIVE

## 2015-03-27 MED ORDER — FLUCONAZOLE 150 MG PO TABS: 150.0000 mg | ORAL_TABLET | Freq: Once | ORAL | Status: DC

## 2015-03-27 NOTE — Discharge Instructions (Signed)
I do not see any sign of urinary tract infection. I have sent your urine for culture to make sure the infection has been cleared. The itching and irritation have is likely from some lingering yeast. Take Diflucan one pill today. Take the second pill if you are still having itching in 3 days. Follow-up as needed.

## 2015-03-27 NOTE — ED Notes (Signed)
Patient states that she has had a UTI x1 month and has taken 3 different antibiotics which eventually led to Yeast infection and she took an OTC treatment  No acute distress

## 2015-03-27 NOTE — ED Provider Notes (Signed)
CSN: [id number]     Arrival date & time 03/27/15  1523 History   First MD Initiated Contact with Patient 03/27/15 1616     Chief Complaint  Patient presents with  . Urinary Tract Infection  . Vaginitis   (Consider location/radiation/quality/duration/timing/severity/associated sxs/prior Treatment) HPI  She is a 25 year old woman here for follow-up UTI. She states she has been diagnosed with several UTIs over the last month. She has been on several courses of antibiotics which caused her to have a yeast infection. She got over-the-counter Monistat yesterday which has helped, but not resolved the itching and irritation in the vaginal area. She denies any dysuria, urinary frequency, urgency. No abdominal pain. No fevers or chills.  Past Medical History  Diagnosis Date  . UTI (lower urinary tract infection)   . Ovarian cyst    History reviewed. No pertinent past surgical history. No family history on file. Social History  Substance Use Topics  . Smoking status: Never Smoker   . Smokeless tobacco: Never Used  . Alcohol Use: No   OB History    No data available     Review of Systems As in history of present illness Allergies  Review of patient's allergies indicates no known allergies.  Home Medications   Prior to Admission medications   Medication Sig Start Date End Date Taking? Authorizing Provider  OLANZapine (ZYPREXA) 10 MG tablet Take 10 mg by mouth at bedtime.   Yes Historical Provider, MD  fluconazole (DIFLUCAN) 150 MG tablet Take 1 tablet (150 mg total) by mouth once. Take 2nd pill if symptoms not completely resolved in 3 days. 03/27/15   Charm Rings, MD   Meds Ordered and Administered this Visit  Medications - No data to display  BP 134/88 mmHg  Pulse 67  Temp(Src) 98.6 F (37 C) (Oral)  Resp 12  SpO2 100%  LMP 03/18/2015 (Exact Date) No data found.   Physical Exam  Constitutional: She is oriented to person, place, and time. She appears well-developed and  well-nourished. No distress.  Cardiovascular: Normal rate.   Pulmonary/Chest: Effort normal.  Neurological: She is alert and oriented to person, place, and time.    ED Course  Procedures (including critical care time)  Labs Review Labs Reviewed  POCT URINALYSIS DIP (DEVICE) - Abnormal; Notable for the following:    Leukocytes, UA SMALL (*)    All other components within normal limits  URINE CULTURE  POCT PREGNANCY, URINE    Imaging Review No results found.    MDM   1. Vaginitis    UA was slightly leukocytes. I suspect this is left over from yeast infection. I sent the urine for culture to make sure her UTI has cleared. Diflucan for yeast. Follow-up as needed.    Charm Rings, MD 03/27/15 [phone]

## 2015-03-29 LAB — URINE CULTURE: Culture: >=100000

## 2015-04-03 ENCOUNTER — Emergency Department (HOSPITAL_COMMUNITY)
Admission: EM | Admit: 2015-04-03 | Discharge: 2015-04-03 | Disposition: A | Discharge status: Home / Self Care | Payer: Medicare Other | Attending: Emergency Medicine | Admitting: Emergency Medicine

## 2015-04-03 ENCOUNTER — Encounter (HOSPITAL_COMMUNITY): Payer: Self-pay | Admitting: Emergency Medicine

## 2015-04-03 DIAGNOSIS — R300 Dysuria: Secondary | ICD-10-CM | POA: Diagnosis present

## 2015-04-03 DIAGNOSIS — Z79899 Other long term (current) drug therapy: Secondary | ICD-10-CM | POA: Diagnosis not present

## 2015-04-03 DIAGNOSIS — Z8742 Personal history of other diseases of the female genital tract: Secondary | ICD-10-CM | POA: Insufficient documentation

## 2015-04-03 DIAGNOSIS — R3915 Urgency of urination: Secondary | ICD-10-CM | POA: Insufficient documentation

## 2015-04-03 DIAGNOSIS — Z3202 Encounter for pregnancy test, result negative: Secondary | ICD-10-CM | POA: Insufficient documentation

## 2015-04-03 DIAGNOSIS — Z87440 Personal history of urinary (tract) infections: Secondary | ICD-10-CM | POA: Diagnosis not present

## 2015-04-03 LAB — URINE MICROSCOPIC-ADD ON

## 2015-04-03 LAB — URINALYSIS, ROUTINE W REFLEX MICROSCOPIC
Bilirubin Urine: NEGATIVE
GLUCOSE, UA: NEGATIVE mg/dL
Hgb urine dipstick: NEGATIVE
KETONES UR: NEGATIVE mg/dL
NITRITE: NEGATIVE
PROTEIN: NEGATIVE mg/dL
Specific Gravity, Urine: 1.025 (ref 1.005–1.030)
pH: 6.5 (ref 5.0–8.0)

## 2015-04-03 LAB — POC URINE PREG, ED: PREG TEST UR: NEGATIVE

## 2015-04-03 MED ORDER — PHENAZOPYRIDINE HCL 200 MG PO TABS: 200.0000 mg | ORAL_TABLET | Freq: Three times a day (TID) | ORAL | Status: DC

## 2015-04-03 MED ORDER — CEPHALEXIN 500 MG PO CAPS: 500.0000 mg | ORAL_CAPSULE | Freq: Two times a day (BID) | ORAL | Status: DC

## 2015-04-03 NOTE — ED Notes (Signed)
Pt sts dysuria and frequent urination; pt sts recent yeast infection; pt sts pressure when going to bathroom; pt denies vaginal discharge

## 2015-04-03 NOTE — Discharge Instructions (Signed)
Read the information below.  Use the prescribed medication as directed.  Please discuss all new medications with your pharmacist.  You may return to the Emergency Department at any time for worsening condition or any new symptoms that concern you.   If you develop high fevers, worsening abdominal pain, uncontrolled vomiting, or are unable to tolerate fluids by mouth, return to the ER for a recheck.  ° °

## 2015-04-03 NOTE — ED Provider Notes (Signed)
CSN: [id number]     Arrival date & time 04/03/15  1131 History  By signing my name below, I, Mia Brown, attest that this documentation has been prepared under the direction and in the presence of non-physician practitioner, Mia Dredge, PA-C. Electronically Signed: Freida Brown, Scribe. 04/03/2015. 1:46 PM.     Chief Complaint  Patient presents with  . Dysuria    The history is provided by the patient. No language interpreter was used.     HPI Comments:  Mia Brown is a 25 y.o. female who presents to the Emergency Department complaining of frequent urination x 1 week with an associated pressure with urination. She denies pain with urination, fever, chills, abnormal vaginal discharge, vaginal bleeding,  blood in stool and recent abnormal BMs. Pt notes frequent UTIs in the last 3 months; states she has been treated 3 times at [facility] and in the ED with improvement of her symptoms.  Pt was also recently treated for a yeast infection, notes vaginal irritation has resolved. She denies h/o abdominal surgeries. LNMP 03/18/15.   Past Medical History  Diagnosis Date  . UTI (lower urinary tract infection)   . Ovarian cyst    History reviewed. No pertinent past surgical history. History reviewed. No pertinent family history. Social History  Substance Use Topics  . Smoking status: Never Smoker   . Smokeless tobacco: Never Used  . Alcohol Use: No   OB History    No data available     Review of Systems  Constitutional: Negative for fever and chills.  Gastrointestinal: Positive for blood in stool.  Genitourinary: Positive for frequency. Negative for dysuria, vaginal bleeding and vaginal discharge.  All other systems reviewed and are negative.  Allergies  Review of patient's allergies indicates no known allergies.  Home Medications   Prior to Admission medications   Medication Sig Start Date End Date Taking? Authorizing Provider  cephALEXin (KEFLEX) 500 MG capsule Take 1 capsule (500 mg  total) by mouth 2 (two) times daily. 04/03/15   Mia Dredge, PA-C  fluconazole (DIFLUCAN) 150 MG tablet Take 1 tablet (150 mg total) by mouth once. Take 2nd pill if symptoms not completely resolved in 3 days. 03/27/15   Mia Rings, MD  OLANZapine (ZYPREXA) 10 MG tablet Take 10 mg by mouth at bedtime.    Historical Provider, MD  phenazopyridine (PYRIDIUM) 200 MG tablet Take 1 tablet (200 mg total) by mouth 3 (three) times daily. 04/03/15   Mia Dredge, PA-C   BP 123/90 mmHg  Pulse 63  Temp(Src) 98 F (36.7 C) (Oral)  Resp 18  SpO2 98%  LMP 03/18/2015 (Exact Date) Physical Exam  Constitutional: She appears well-developed and well-nourished. No distress.  HENT:  Head: Normocephalic and atraumatic.  Neck: Neck supple.  Cardiovascular: Normal rate and regular rhythm.   Pulmonary/Chest: Effort normal and breath sounds normal. No respiratory distress. She has no wheezes. She has no rales.  Abdominal: Soft. She exhibits no distension. There is no tenderness. There is no rebound and no guarding.  Neurological: She is alert.  Skin: She is not diaphoretic.  Nursing note and vitals reviewed.   ED Course  Procedures   DIAGNOSTIC STUDIES:  Oxygen Saturation is 98% on RA, normal by my interpretation.    COORDINATION OF CARE:  1:29 PM Will order UA and discharge with symptomatic treatment and referral to urology.  Discussed treatment plan with pt at bedside and pt agreed to plan.  Labs Review Labs Reviewed  URINALYSIS, ROUTINE W  REFLEX MICROSCOPIC (NOT AT [facility]) - Abnormal; Notable for the following:    APPearance CLOUDY (*)    Leukocytes, UA MODERATE (*)    All other components within normal limits  URINE MICROSCOPIC-ADD ON - Abnormal; Notable for the following:    Squamous Epithelial / LPF 6-30 (*)    Bacteria, UA FEW (*)    All other components within normal limits  URINE CULTURE  POC URINE PREG, ED   I have personally reviewed and evaluated these lab results as part of my medical  decision-making.   MDM   Final diagnoses:  Urinary urgency    Afebrile, nontoxic patient with recurrent UTI symptoms.  UA is contaminated with squamous cells but does contain 6-30 WBC, moderate leukocytes, only few bacteria.  Doubt ureteral stone/renal colic.  Abdominal exam is benign.  Denies vaginal symptoms currently.  Pt is very worried about an underlying cause for the recurrent infection. Will refer to urology.   D/C home with keflex, pyridium, urology follow up.  Discussed result, findings, treatment, and follow up  with patient.  Pt given return precautions.  Pt verbalizes understanding and agrees with plan.        I personally performed the services described in this documentation, which was scribed in my presence. The recorded information has been reviewed and is accurate.   Mia Dredgemily Aycen Porreca, PA-C 04/03/15 1823  Mancel BaleElliott Wentz, MD 04/05/15 0930

## 2015-04-04 LAB — URINE CULTURE: Culture: 1000

## 2015-06-04 ENCOUNTER — Emergency Department (HOSPITAL_COMMUNITY)
Admission: EM | Admit: 2015-06-04 | Discharge: 2015-06-05 | Disposition: A | Discharge status: Home / Self Care | Payer: Medicare Other | Attending: Emergency Medicine | Admitting: Emergency Medicine

## 2015-06-04 ENCOUNTER — Encounter (HOSPITAL_COMMUNITY): Payer: Self-pay

## 2015-06-04 DIAGNOSIS — N926 Irregular menstruation, unspecified: Secondary | ICD-10-CM | POA: Diagnosis not present

## 2015-06-04 DIAGNOSIS — R300 Dysuria: Secondary | ICD-10-CM | POA: Insufficient documentation

## 2015-06-04 LAB — URINE MICROSCOPIC-ADD ON

## 2015-06-04 LAB — URINALYSIS, ROUTINE W REFLEX MICROSCOPIC
Bilirubin Urine: NEGATIVE
Glucose, UA: NEGATIVE mg/dL
Ketones, ur: 15 mg/dL — AB
Nitrite: NEGATIVE
Protein, ur: NEGATIVE mg/dL
Specific Gravity, Urine: 1.022 (ref 1.005–1.030)
pH: 5 (ref 5.0–8.0)

## 2015-06-04 NOTE — ED Notes (Signed)
Patient here with complaint of dysuria and irregular periods. Currently being treated for vaginosis. No distress

## 2015-06-04 NOTE — ED Notes (Signed)
PT HAS DECIDED TO LEAVE. PT MOVED OFF THE FLOOR.

## 2015-06-08 ENCOUNTER — Encounter (HOSPITAL_COMMUNITY): Payer: Self-pay | Admitting: *Deleted

## 2015-06-08 ENCOUNTER — Emergency Department (HOSPITAL_COMMUNITY)
Admission: EM | Admit: 2015-06-08 | Discharge: 2015-06-08 | Disposition: A | Discharge status: Home / Self Care | Payer: Medicare Other | Attending: Emergency Medicine | Admitting: Emergency Medicine

## 2015-06-08 DIAGNOSIS — R102 Pelvic and perineal pain: Secondary | ICD-10-CM | POA: Diagnosis not present

## 2015-06-08 DIAGNOSIS — N926 Irregular menstruation, unspecified: Secondary | ICD-10-CM | POA: Diagnosis not present

## 2015-06-08 DIAGNOSIS — R350 Frequency of micturition: Secondary | ICD-10-CM

## 2015-06-08 DIAGNOSIS — R1030 Lower abdominal pain, unspecified: Secondary | ICD-10-CM | POA: Diagnosis not present

## 2015-06-08 DIAGNOSIS — Z3202 Encounter for pregnancy test, result negative: Secondary | ICD-10-CM | POA: Insufficient documentation

## 2015-06-08 LAB — URINALYSIS, ROUTINE W REFLEX MICROSCOPIC
BILIRUBIN URINE: NEGATIVE
Glucose, UA: NEGATIVE mg/dL
Hgb urine dipstick: NEGATIVE
Ketones, ur: 40 mg/dL — AB
NITRITE: NEGATIVE
Protein, ur: NEGATIVE mg/dL
SPECIFIC GRAVITY, URINE: 1.021 (ref 1.005–1.030)
pH: 5 (ref 5.0–8.0)

## 2015-06-08 LAB — CBC
HCT: 40.7 % (ref 36.0–46.0)
HEMOGLOBIN: 13.7 g/dL (ref 12.0–15.0)
MCH: 28.4 pg (ref 26.0–34.0)
MCHC: 33.7 g/dL (ref 30.0–36.0)
MCV: 84.4 fL (ref 78.0–100.0)
Platelets: 284 10*3/uL (ref 150–400)
RBC: 4.82 MIL/uL (ref 3.87–5.11)
RDW: 14.1 % (ref 11.5–15.5)
WBC: 5.2 10*3/uL (ref 4.0–10.5)

## 2015-06-08 LAB — COMPREHENSIVE METABOLIC PANEL
ALK PHOS: 29 U/L — AB (ref 38–126)
ALT: 14 U/L (ref 14–54)
AST: 22 U/L (ref 15–41)
Albumin: 4.7 g/dL (ref 3.5–5.0)
Anion gap: 12 (ref 5–15)
BUN: 8 mg/dL (ref 6–20)
CALCIUM: 9.8 mg/dL (ref 8.9–10.3)
CHLORIDE: 102 mmol/L (ref 101–111)
CO2: 22 mmol/L (ref 22–32)
CREATININE: 0.87 mg/dL (ref 0.44–1.00)
Glucose, Bld: 78 mg/dL (ref 65–99)
Potassium: 4.7 mmol/L (ref 3.5–5.1)
Sodium: 136 mmol/L (ref 135–145)
TOTAL PROTEIN: 8.1 g/dL (ref 6.5–8.1)
Total Bilirubin: 0.6 mg/dL (ref 0.3–1.2)

## 2015-06-08 LAB — POC URINE PREG, ED: PREG TEST UR: NEGATIVE

## 2015-06-08 LAB — WET PREP, GENITAL
Clue Cells Wet Prep HPF POC: NONE SEEN
SPERM: NONE SEEN
Trich, Wet Prep: NONE SEEN
YEAST WET PREP: NONE SEEN

## 2015-06-08 LAB — URINE MICROSCOPIC-ADD ON

## 2015-06-08 LAB — LIPASE, BLOOD: Lipase: 63 U/L — ABNORMAL HIGH (ref 11–51)

## 2015-06-08 MED ORDER — CEPHALEXIN 500 MG PO CAPS: 500.0000 mg | ORAL_CAPSULE | Freq: Four times a day (QID) | ORAL | Status: AC

## 2015-06-08 NOTE — ED Provider Notes (Signed)
CSN: [id number]     Arrival date & time 06/08/15  [id number] History   First MD Initiated Contact with Patient 06/08/15 1120     Chief Complaint  Patient presents with  . Abdominal Pain  . Urinary Frequency    HPI Comments: 25 year old female who presents with lower abdominal pain and urinary frequency for the past several months. She states this has been ongoing since January. She has been on several courses of antibiotics which does seem to clear up her symptoms temporarily. Her abdominal pain is in the right pelvis and suprapubic area. Feels like pressure and is constant. Reports associated urinary frequency however no dysuria or urgency. Denies fever, chills, upper abdominal pain, N/V/D, constipation, blood in urine or stool, vaginal discharge or pain. Denies hx of STDs. Not on birth control. LMP was late April which was not a normal period. It was light spotting instead of a full period. She has had a pelvic and TVUS in January here which was unremarkable. She has also recently finished a course of Flagyl for BV.  Patient is a 25 y.o. female presenting with abdominal pain and frequency.  Abdominal Pain Associated symptoms: no chills, no constipation, no diarrhea, no dysuria, no fever, no hematuria, no nausea, no vaginal bleeding, no vaginal discharge and no vomiting   Urinary Frequency Associated symptoms include abdominal pain. Pertinent negatives include no chills, fever, nausea or vomiting.    Past Medical History  Diagnosis Date  . UTI (lower urinary tract infection)   . Ovarian cyst    History reviewed. No pertinent past surgical history. No family history on file. Social History  Substance Use Topics  . Smoking status: Never Smoker   . Smokeless tobacco: Never Used  . Alcohol Use: No   OB History    No data available     Review of Systems  Constitutional: Negative for fever and chills.  Gastrointestinal: Positive for abdominal pain. Negative for nausea, vomiting, diarrhea and  constipation.  Genitourinary: Positive for frequency, menstrual problem and pelvic pain. Negative for dysuria, urgency, hematuria, flank pain, decreased urine volume, vaginal bleeding, vaginal discharge, difficulty urinating, genital sores, vaginal pain and dyspareunia.  All other systems reviewed and are negative.   Allergies  Review of patient's allergies indicates no known allergies.  Home Medications   Prior to Admission medications   Medication Sig Start Date End Date Taking? Authorizing Provider  metroNIDAZOLE (FLAGYL) 500 MG tablet Take 500 mg by mouth 2 (two) times daily. 05/31/15 06/08/15 Yes Historical Provider, MD  OLANZapine (ZYPREXA) 10 MG tablet Take 10 mg by mouth at bedtime.   Yes Historical Provider, MD  cephALEXin (KEFLEX) 500 MG capsule Take 1 capsule (500 mg total) by mouth 2 (two) times daily. Patient not taking: Reported on 06/08/2015 04/03/15   Trixie DredgeEmily West, PA-C  fluconazole (DIFLUCAN) 150 MG tablet Take 1 tablet (150 mg total) by mouth once. Take 2nd pill if symptoms not completely resolved in 3 days. Patient not taking: Reported on 06/08/2015 03/27/15   Charm RingsErin J Honig, MD  phenazopyridine (PYRIDIUM) 200 MG tablet Take 1 tablet (200 mg total) by mouth 3 (three) times daily. Patient not taking: Reported on 06/08/2015 04/03/15   Trixie DredgeEmily West, PA-C   BP 117/87 mmHg  Pulse 74  Temp(Src) 98 F (36.7 C) (Oral)  Resp 18  SpO2 100%  LMP 05/31/2015   Physical Exam  Constitutional: She is oriented to person, place, and time. She appears well-developed and well-nourished. No distress.  HENT:  Head: Normocephalic and atraumatic.  Eyes: Conjunctivae are normal. Pupils are equal, round, and reactive to light. Right eye exhibits no discharge. Left eye exhibits no discharge. No scleral icterus.  Neck: Normal range of motion.  Cardiovascular: Normal rate and regular rhythm.  Exam reveals no gallop and no friction rub.   No murmur heard. Pulmonary/Chest: Effort normal and breath sounds  normal. No respiratory distress. She has no wheezes. She has no rales. She exhibits no tenderness.  Abdominal: Soft. She exhibits no distension and no mass. There is tenderness. There is no rebound, no guarding and no CVA tenderness.  Suprapubic and R iliac tenderness  Genitourinary:  Chaperone present during exam. No inguinal lymphadenopathy or inguinal hernia noted. Normal external genitalia. No pain with speculum insertion. Close cervical os with out any significant discharge noted. Os with normal appearance without rash or lesions. On bimanual examination no adnexal tenderness or cervical motion tenderness. Minimal suprapubic tenderness    Neurological: She is alert and oriented to person, place, and time.  Skin: Skin is warm and dry.  Psychiatric: She has a normal mood and affect.    ED Course  Procedures (including critical care time) Labs Review Labs Reviewed  WET PREP, GENITAL - Abnormal; Notable for the following:    WBC, Wet Prep HPF POC MANY (*)    All other components within normal limits  COMPREHENSIVE METABOLIC PANEL - Abnormal; Notable for the following:    Alkaline Phosphatase 29 (*)    All other components within normal limits  URINALYSIS, ROUTINE W REFLEX MICROSCOPIC (NOT AT [facility]) - Abnormal; Notable for the following:    APPearance CLOUDY (*)    Ketones, ur 40 (*)    Leukocytes, UA SMALL (*)    All other components within normal limits  LIPASE, BLOOD - Abnormal; Notable for the following:    Lipase 63 (*)    All other components within normal limits  URINE MICROSCOPIC-ADD ON - Abnormal; Notable for the following:    Squamous Epithelial / LPF 6-30 (*)    Bacteria, UA MANY (*)    All other components within normal limits  URINE CULTURE  CBC  POC URINE PREG, ED  GC/CHLAMYDIA PROBE AMP () NOT AT [facility]     MDM   Final diagnoses:  Urinary frequency   25 year old female who presents with urinary frequency and lower abdominal pain. Her symptoms have  been ongoing for the past several months without a clear answer as to why she is having her symptoms.   Vitals are WNL and stable. PE is suggestive of GU pathology rather than intraabdominal pathology. 1/28 TVUS was normal. All her UAs have been suggestive of UTI however cultures have never grown anything. UA today shows WBC with ketones, leukocytes, and many bacteria however sample is contaminated. Culture sent. CMP is unremarkable. CBC is unremarkable. Lipase elevated at 63 however there is no upper abdominal tenderness. Pregnancy test is neg. Wet prep shows many WBC. G&C culture sent however low suspicion for STD.  Discussed with patient that she needs to have Urology follow up. Course of Keflex prescribed. No imaging indicated at this time. Patient is NAD, non-toxic, with stable VS. Patient is informed of clinical course, understands medical decision making process, and agrees with plan. Opportunity for questions provided and all questions answered. Return precautions given.       Bethel Born, PA-C 06/08/15 1420  Vanetta Mulders, MD 06/09/15 [phone]

## 2015-06-08 NOTE — ED Notes (Signed)
PT thinks she is having UTI symptoms. PT states that she has been having constant UTIs since January.  Pt states finished menstrual that was abnormal.  Pt reports lower abdominal pain

## 2015-06-08 NOTE — Discharge Instructions (Signed)
Urinary Frequency °The number of times a normal person urinates depends upon how much liquid they take in and how much liquid they are losing. If the temperature is hot and there is high humidity, then the person will sweat more and usually breathe a little more frequently. These factors decrease the amount of frequency of urination that would be considered normal. °The amount you drink is easily determined, but the amount of fluid lost is sometimes more difficult to calculate.  °Fluid is lost in two ways: °· Sensible fluid loss is usually measured by the amount of urine that you get rid of. Losses of fluid can also occur with diarrhea. °· Insensible fluid loss is more difficult to measure. It is caused by evaporation. Insensible loss of fluid occurs through breathing and sweating. It usually ranges from a little less than a quart to a little more than a quart of fluid a day. °In normal temperatures and activity levels, the average person may urinate 4 to 7 times in a 24-hour period. Needing to urinate more often than that could indicate a problem. If one urinates 4 to 7 times in 24 hours and has large volumes each time, that could indicate a different problem from one who urinates 4 to 7 times a day and has small volumes. The time of urinating is also important. Most urinating should be done during the waking hours. Getting up at night to urinate frequently can indicate some problems. °CAUSES  °The bladder is the organ in your lower abdomen that holds urine. Like a balloon, it swells some as it fills up. Your nerves sense this and tell you it is time to head for the bathroom. There are a number of reasons that you might feel the need to urinate more often than usual. They include: °· Urinary tract infection. This is usually associated with other signs such as burning when you urinate. °· In men, problems with the prostate (a walnut-size gland that is located near the tube that carries urine out of your body). There  are two reasons why the prostate can cause an increased frequency of urination: °¨ An enlarged prostate that does not let the bladder empty well. If the bladder only half empties when you urinate, then it only has half the capacity to fill before you have to urinate again. °¨ The nerves in the bladder become more hypersensitive with an increased size of the prostate even if the bladder empties completely. °· Pregnancy. °· Obesity. Excess weight is more likely to cause a problem for women than for men. °· Bladder stones or other bladder problems. °· Caffeine. °· Alcohol. °· Medications. For example, drugs that help the body get rid of extra fluid (diuretics) increase urine production. Some other medicines must be taken with lots of fluids. °· Muscle or nerve weakness. This might be the result of a spinal cord injury, a stroke, multiple sclerosis, or Parkinson disease. °· Long-standing diabetes can decrease the sensation of the bladder. This loss of sensation makes it harder to sense the bladder needs to be emptied. Over a period of years, the bladder is stretched out by constant overfilling. This weakens the bladder muscles so that the bladder does not empty well and has less capacity to fill with new urine. °· Interstitial cystitis (also called painful bladder syndrome). This condition develops because the tissues that line the inside of the bladder are inflamed (inflammation is the body's way of reacting to injury or infection). It causes pain and frequent   urination. It occurs in women more often than in men. DIAGNOSIS   To decide what might be causing your urinary frequency, your health care provider will probably:  Ask about symptoms you have noticed.  Ask about your overall health. This will include questions about any medications you are taking.  Do a physical examination.  Order some tests. These might include:  A blood test to check for diabetes or other health issues that could be contributing  to the problem.  Urine testing. This could measure the flow of urine and the pressure on the bladder.  A test of your neurological system (the brain, spinal cord, and nerves). This is the system that senses the need to urinate.  A bladder test to check whether it is emptying completely when you urinate.  Cystoscopy. This test uses a thin tube with a tiny camera on it. It offers a look inside your urethra and bladder to see if there are problems.  Imaging tests. You might be given a contrast dye and then asked to urinate. X-rays are taken to see how your bladder is working. TREATMENT  It is important for you to be evaluated to determine if the amount or frequency that you have is unusual or abnormal. If it is found to be abnormal, the cause should be determined and this can usually be found out easily. Depending upon the cause, treatment could include medication, stimulation of the nerves, or surgery. There are not too many things that you can do as an individual to change your urinary frequency. It is important that you balance the amount of fluid intake needed to compensate for your activity and the temperature. Medical problems will be diagnosed and taken care of by your physician. There is no particular bladder training such as Kegel exercises that you can do to help urinary frequency. This is an exercise that is usually recommended for people who have leaking of urine when they laugh, cough, or sneeze. HOME CARE INSTRUCTIONS   Take any medications your health care provider prescribed or suggested. Follow the directions carefully.  Practice any lifestyle changes that are recommended. These might include:  Drinking less fluid or drinking at different times of the day. If you need to urinate often during the night, for example, you may need to stop drinking fluids early in the evening.  Cutting down on caffeine or alcohol. They both can make you need to urinate more often than normal. Caffeine  is found in coffee, tea, and sodas.  Losing weight, if that is recommended.  Keep a journal or a log. You might be asked to record how much you drink and when and where you feel the need to urinate. This will also help evaluate how well the treatment provided by your physician is working. SEEK MEDICAL CARE IF:   Your need to urinate often gets worse.  You feel increased pain or irritation when you urinate.  You notice blood in your urine.  You have questions about any medications that your health care provider recommended.  You notice blood, pus, or swelling at the site of any test or treatment procedure.  You develop a fever of more than 100.39F (38.1C). SEEK IMMEDIATE MEDICAL CARE IF:  You develop a fever of more than 102.2F (38.9C).   This information is not intended to replace advice given to you by your health care provider. Make sure you discuss any questions you have with your health care provider.   Please follow up with a  urologist for further evaluation of your urinary symptoms.

## 2015-06-08 NOTE — ED Notes (Signed)
Pt ambulates independently and with steady gait at time of discharge. Discharge instructions and follow up information reviewed with patient. No other questions or concerns voiced at this time.  

## 2015-06-08 NOTE — ED Notes (Signed)
Pelvic cart at bedside. 

## 2015-06-09 LAB — URINE CULTURE

## 2015-06-09 LAB — GC/CHLAMYDIA PROBE AMP (~~LOC~~) NOT AT ARMC
Chlamydia: NEGATIVE
Neisseria Gonorrhea: NEGATIVE

## 2017-09-23 IMAGING — US US PELVIS COMPLETE
1 series · 14 of 25 positions shown · non-contrast
Comparison: None

CLINICAL DATA: Two week history of right lower quadrant pain.



[Series 1: us pelvis complete · 0.17mm/px · 14 of 48 slices shown]
[im 1/48]
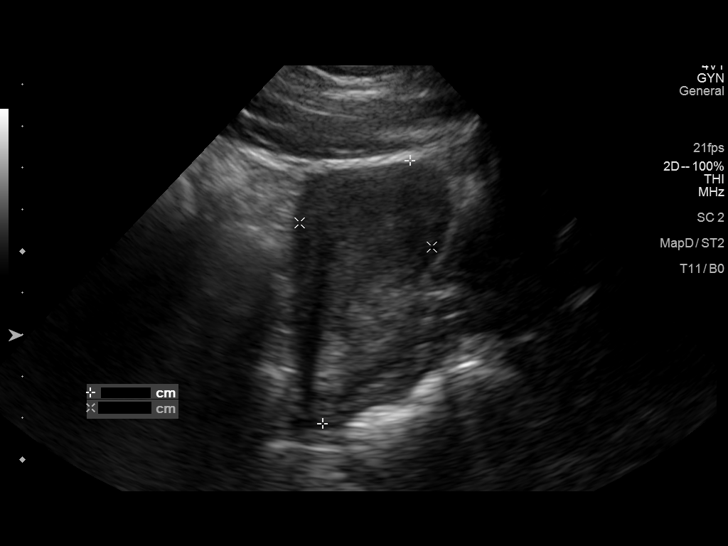
[im 4/48]
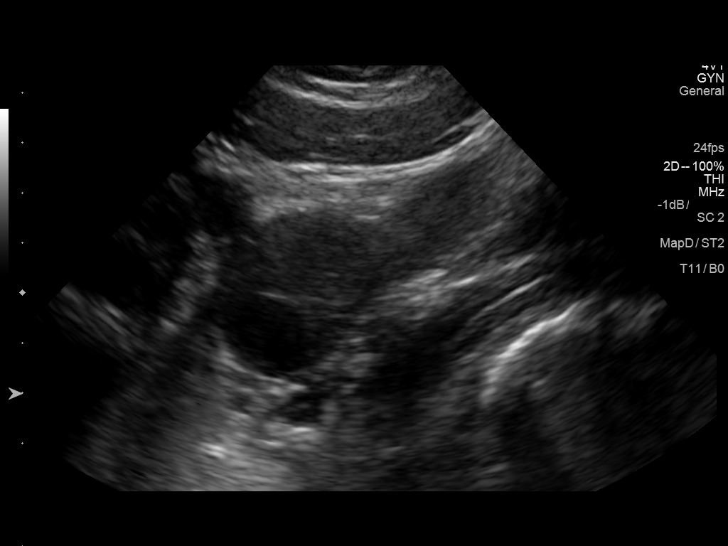
[im 8/48]
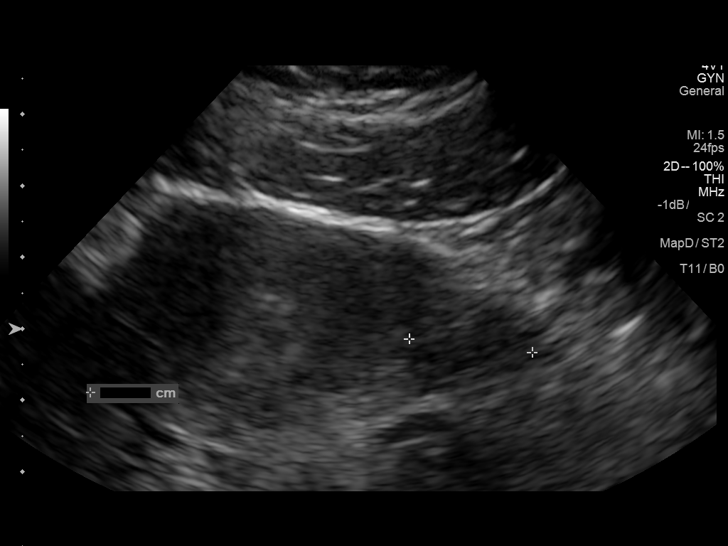
[im 12/48]
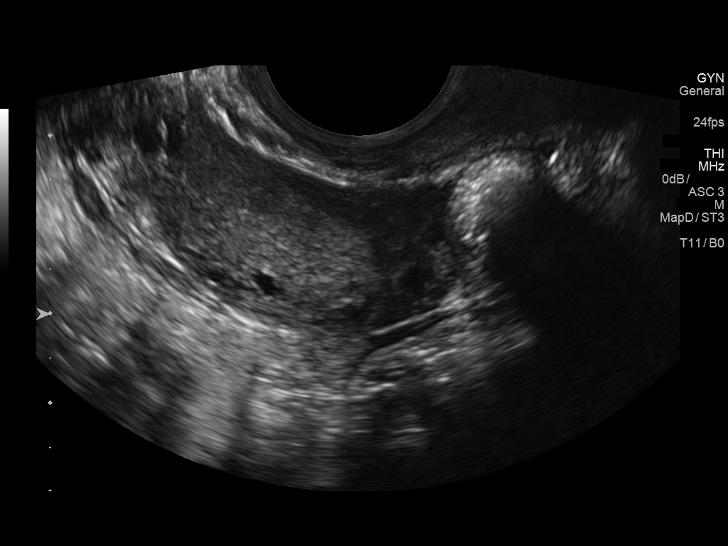
[im 16/48]
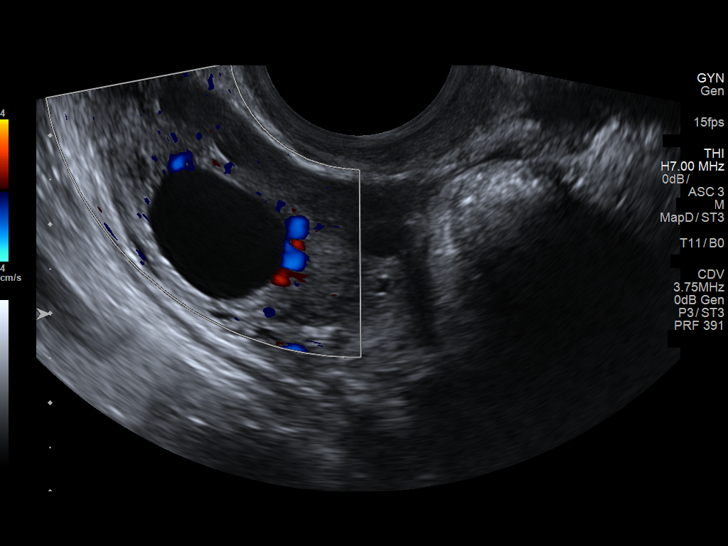
[im 18/48]
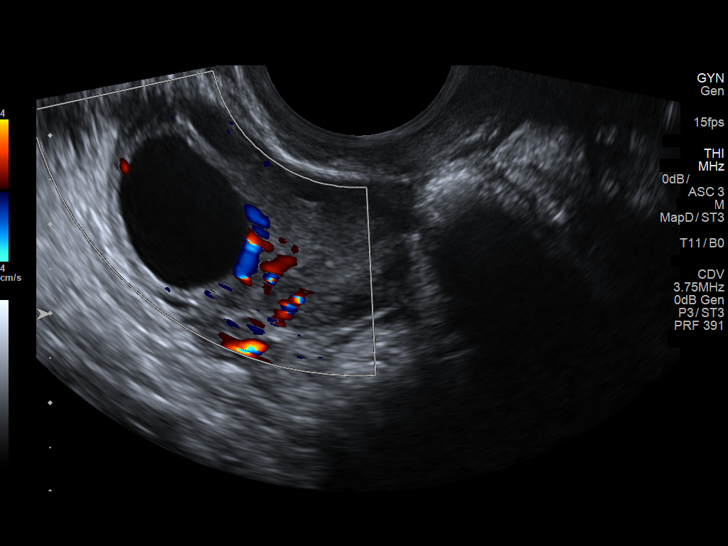
[im 22/48]
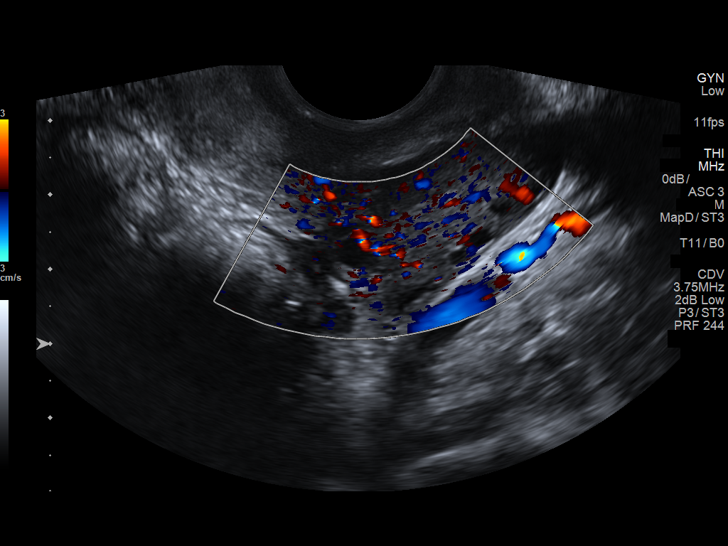
[im 26/48]
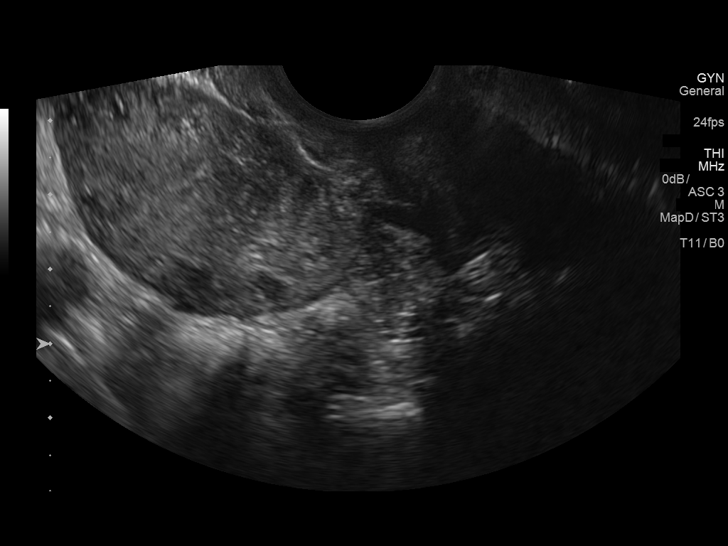
[im 30/48]
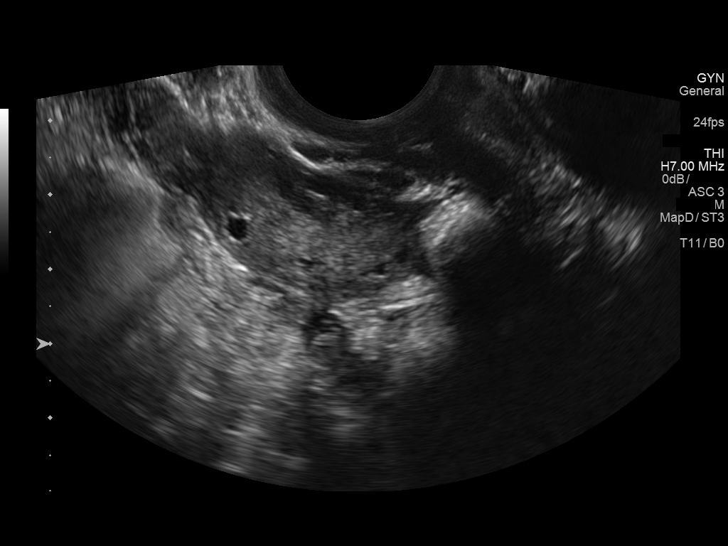
[im 32/48]
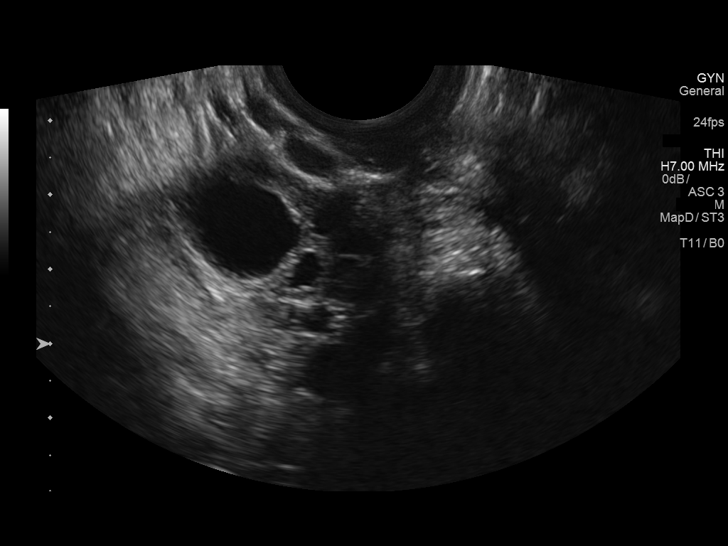
[im 36/48]
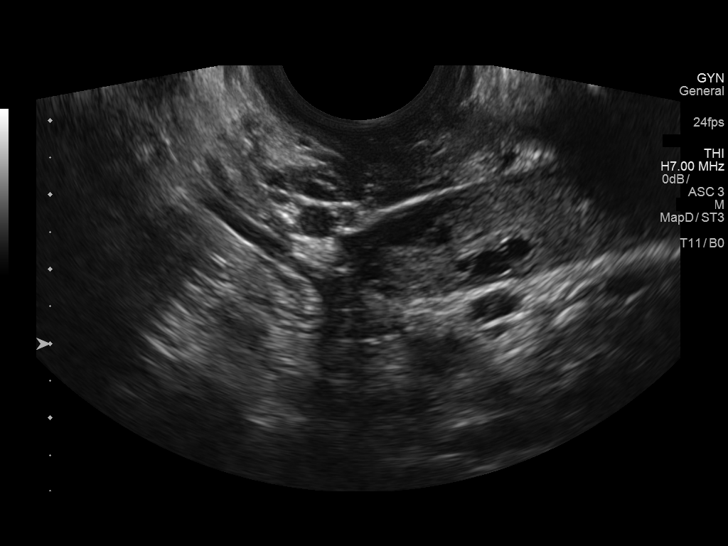
[im 40/48]
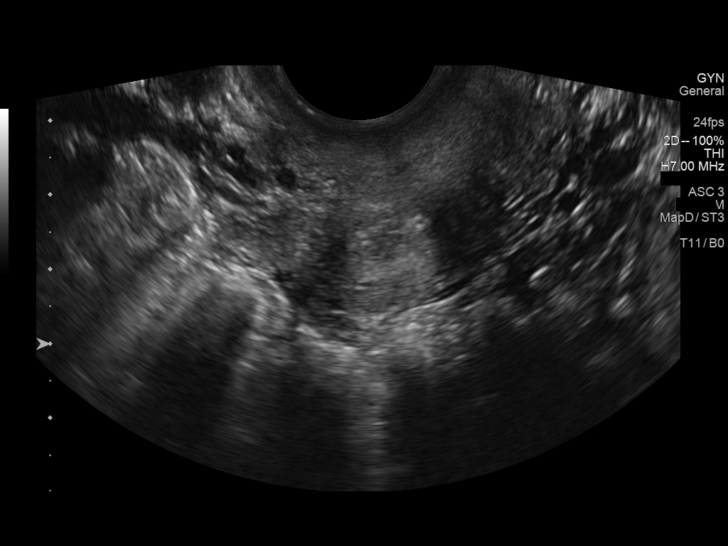
[im 44/48]
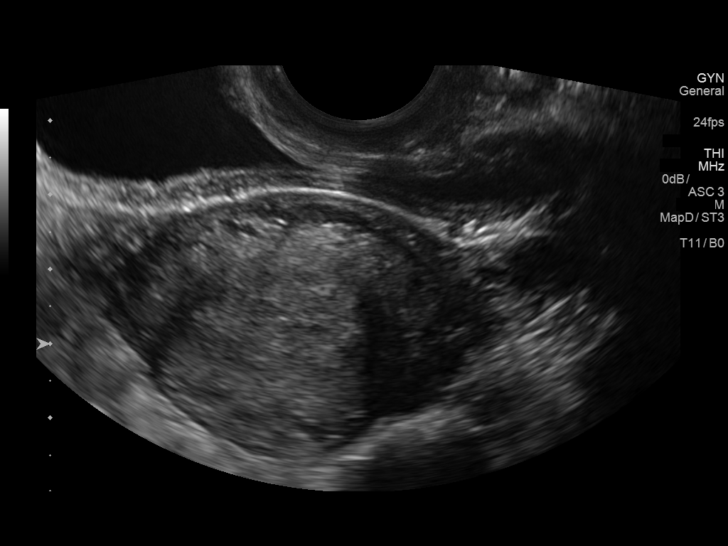
[im 48/48]
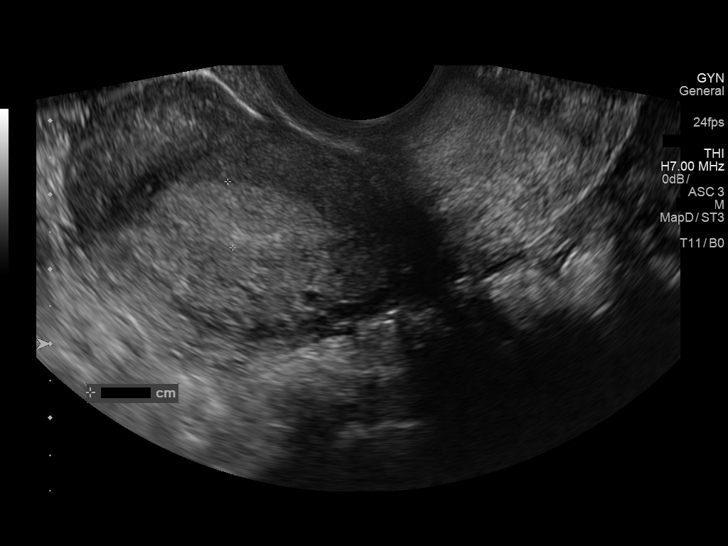

[14 of 25 positions shown; findings below may reference images not displayed]

FINDINGS: Uterus

Measurements: 8.2 x 3.7 x 4.6 cm. No fibroids or other mass
visualized.

Endometrium

Thickness: 9 mm.  No focal abnormality visualized.

Right ovary

Measurements: 3.5 x 2.1 x 3.0 cm. Normal appearance/no adnexal mass.

Left ovary

Measurements: 4.5 x 1.6 x 3.0 cm. Normal appearance/no adnexal mass.

Other findings

No abnormal free fluid.
IMPRESSION: Normal pelvic ultrasound.
